# Patient Record
Sex: Female | Born: 1980 | Race: White | Hispanic: No | Marital: Single | Smoking: Current every day smoker
Health system: Southern US, Community
[De-identification: ages and names within clinical notes are randomized; demographics above are authoritative.]

## PROBLEM LIST (undated history)

## (undated) DIAGNOSIS — F419 Anxiety disorder, unspecified: Secondary | ICD-10-CM

## (undated) HISTORY — PX: MANDIBLE RECONSTRUCTION: SHX431

---
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
## 1999-11-27 ENCOUNTER — Encounter: Payer: Self-pay | Admitting: Family Medicine

## 1999-11-27 ENCOUNTER — Encounter: Admission: RE | Admit: 1999-11-27 | Discharge: 1999-11-27 | Payer: Self-pay | Admitting: Family Medicine

## 2000-12-25 ENCOUNTER — Emergency Department (HOSPITAL_COMMUNITY): Admission: EM | Admit: 2000-12-25 | Discharge: 2000-12-25 | Payer: Self-pay | Admitting: Physical Therapy

## 2000-12-26 ENCOUNTER — Emergency Department (HOSPITAL_COMMUNITY): Admission: EM | Admit: 2000-12-26 | Discharge: 2000-12-27 | Payer: Self-pay | Admitting: Emergency Medicine

## 2001-02-21 ENCOUNTER — Inpatient Hospital Stay (HOSPITAL_COMMUNITY): Admission: AD | Admit: 2001-02-21 | Discharge: 2001-02-23 | Payer: Self-pay | Admitting: Neurology

## 2001-02-21 ENCOUNTER — Encounter: Payer: Self-pay | Admitting: Neurology

## 2002-06-01 ENCOUNTER — Ambulatory Visit (HOSPITAL_COMMUNITY): Admission: RE | Admit: 2002-06-01 | Discharge: 2002-06-01 | Payer: Self-pay | Admitting: Neurology

## 2002-06-01 ENCOUNTER — Encounter: Payer: Self-pay | Admitting: Neurology

## 2004-05-01 ENCOUNTER — Ambulatory Visit: Payer: Self-pay | Admitting: Family Medicine

## 2004-06-04 ENCOUNTER — Emergency Department (HOSPITAL_COMMUNITY): Admission: EM | Admit: 2004-06-04 | Discharge: 2004-06-04 | Payer: Self-pay | Admitting: Emergency Medicine

## 2004-06-09 ENCOUNTER — Emergency Department (HOSPITAL_COMMUNITY): Admission: EM | Admit: 2004-06-09 | Discharge: 2004-06-09 | Payer: Self-pay | Admitting: Emergency Medicine

## 2004-08-08 ENCOUNTER — Emergency Department (HOSPITAL_COMMUNITY): Admission: EM | Admit: 2004-08-08 | Discharge: 2004-08-08 | Payer: Self-pay | Admitting: Emergency Medicine

## 2005-01-13 ENCOUNTER — Emergency Department (HOSPITAL_COMMUNITY): Admission: EM | Admit: 2005-01-13 | Discharge: 2005-01-13 | Payer: Self-pay | Admitting: Family Medicine

## 2005-02-04 ENCOUNTER — Emergency Department (HOSPITAL_COMMUNITY): Admission: EM | Admit: 2005-02-04 | Discharge: 2005-02-04 | Payer: Self-pay | Admitting: Family Medicine

## 2005-02-12 ENCOUNTER — Emergency Department (HOSPITAL_COMMUNITY): Admission: EM | Admit: 2005-02-12 | Discharge: 2005-02-12 | Payer: Self-pay | Admitting: Family Medicine

## 2005-03-06 ENCOUNTER — Emergency Department (HOSPITAL_COMMUNITY): Admission: EM | Admit: 2005-03-06 | Discharge: 2005-03-06 | Payer: Self-pay | Admitting: Emergency Medicine

## 2005-09-23 ENCOUNTER — Emergency Department (HOSPITAL_COMMUNITY): Admission: EM | Admit: 2005-09-23 | Discharge: 2005-09-23 | Payer: Self-pay | Admitting: Emergency Medicine

## 2017-09-03 ENCOUNTER — Emergency Department (HOSPITAL_BASED_OUTPATIENT_CLINIC_OR_DEPARTMENT_OTHER): Payer: Medicaid Other

## 2017-09-03 ENCOUNTER — Other Ambulatory Visit: Payer: Self-pay

## 2017-09-03 ENCOUNTER — Encounter (HOSPITAL_BASED_OUTPATIENT_CLINIC_OR_DEPARTMENT_OTHER): Payer: Self-pay | Admitting: Emergency Medicine

## 2017-09-03 ENCOUNTER — Emergency Department (HOSPITAL_BASED_OUTPATIENT_CLINIC_OR_DEPARTMENT_OTHER)
Admission: EM | Admit: 2017-09-03 | Discharge: 2017-09-03 | Disposition: A | Discharge status: Home / Self Care | Payer: Medicaid Other | Attending: Emergency Medicine | Admitting: Emergency Medicine

## 2017-09-03 DIAGNOSIS — W010XXA Fall on same level from slipping, tripping and stumbling without subsequent striking against object, initial encounter: Secondary | ICD-10-CM | POA: Insufficient documentation

## 2017-09-03 DIAGNOSIS — Z79899 Other long term (current) drug therapy: Secondary | ICD-10-CM | POA: Diagnosis not present

## 2017-09-03 DIAGNOSIS — S2231XA Fracture of one rib, right side, initial encounter for closed fracture: Secondary | ICD-10-CM | POA: Diagnosis not present

## 2017-09-03 DIAGNOSIS — Y92195 Garage of other specified residential institution as the place of occurrence of the external cause: Secondary | ICD-10-CM | POA: Diagnosis not present

## 2017-09-03 DIAGNOSIS — S299XXA Unspecified injury of thorax, initial encounter: Secondary | ICD-10-CM | POA: Diagnosis present

## 2017-09-03 DIAGNOSIS — Y998 Other external cause status: Secondary | ICD-10-CM | POA: Insufficient documentation

## 2017-09-03 DIAGNOSIS — Y9389 Activity, other specified: Secondary | ICD-10-CM | POA: Diagnosis not present

## 2017-09-03 DIAGNOSIS — F17200 Nicotine dependence, unspecified, uncomplicated: Secondary | ICD-10-CM | POA: Diagnosis not present

## 2017-09-03 DIAGNOSIS — M542 Cervicalgia: Secondary | ICD-10-CM | POA: Insufficient documentation

## 2017-09-03 DIAGNOSIS — W19XXXA Unspecified fall, initial encounter: Secondary | ICD-10-CM

## 2017-09-03 HISTORY — DX: Anxiety disorder, unspecified: F41.9

## 2017-09-03 LAB — PREGNANCY, URINE: Preg Test, Ur: NEGATIVE

## 2017-09-03 MED ORDER — MELOXICAM 15 MG PO TABS: 15.0000 mg | ORAL_TABLET | Freq: Every day | ORAL | 0 refills | Status: AC

## 2017-09-03 MED ORDER — CYCLOBENZAPRINE HCL 10 MG PO TABS: 5.0000 mg | ORAL_TABLET | Freq: Two times a day (BID) | ORAL | 0 refills | Status: AC | PRN

## 2017-09-03 MED ORDER — ONDANSETRON 4 MG PO TBDP
4.0000 mg | ORAL_TABLET | Freq: Once | ORAL | Status: AC
  Administered 2017-09-03: 4 mg via ORAL
  Filled 2017-09-03: qty 1

## 2017-09-03 MED ORDER — HYDROCODONE-ACETAMINOPHEN 5-325 MG PO TABS
1.0000 | ORAL_TABLET | Freq: Once | ORAL | Status: AC
  Administered 2017-09-03: 1 via ORAL
  Filled 2017-09-03: qty 1

## 2017-09-03 MED ORDER — HYDROCODONE-ACETAMINOPHEN 5-325 MG PO TABS: 1.0000 | ORAL_TABLET | Freq: Four times a day (QID) | ORAL | 0 refills | Status: AC | PRN

## 2017-09-03 NOTE — Discharge Instructions (Addendum)
Get help right away if: You have a fever. You have difficulty breathing or shortness of breath. You develop a continual cough, or you cough up thick or bloody sputum. You feel sick to your stomach (nausea), throw up (vomit), or have abdominal pain. You have worsening pain not controlled with medications.

## 2017-09-03 NOTE — ED Triage Notes (Signed)
Patient states that she fell a few days ago from a standing postion onto a cement driveway - the patient reports that she has soreness from her neck to her "butt"

## 2017-09-03 NOTE — ED Notes (Signed)
Pt states she slipped on some motor oil and landed on her buttocks first, then her back. C/O pain from just below neck down to buttocks. MAE x 4. States she did hit her head, but did not lose consciousness. Neuro WNL.

## 2017-09-03 NOTE — ED Provider Notes (Signed)
[facility] EMERGENCY DEPARTMENT Provider Note   CSN: [id number] Arrival date & time: 09/03/17  1952     History   Chief Complaint Chief Complaint  Patient presents with  . Fall    HPI Sierra Alvarez is a 37 y.o. female who presents for evaluation of a fall.  Patient states that she was walking in her garage when she slipped on [employer] oil, her feet flew up and she landed flat on her back.  She states that she held her head up so she would not hit it but thinks she strained the front of her neck.  Since that time she complains of pain from her neck all the way down to her bottom.  She states her pain is worst in the lower to mid thoracic region.  She is some pain when she takes a deep breaths, twists or moves.  She is also having pain with movement of her neck.  She denies any numbness or tingling.  She took ibuprofen with minimal relief of her symptoms.  She states that her pain has been the same since the fall and has not improved or worsened.  HPI  Past Medical History:  Diagnosis Date  . Anxiety     There are no active problems to display for this patient.   Past Surgical History:  Procedure Laterality Date  . MANDIBLE RECONSTRUCTION       OB History   None      Home Medications    Prior to Admission medications   Medication Sig Start Date End Date Taking? Authorizing Provider  gabapentin (NEURONTIN) 300 MG capsule Take 300 mg by mouth 3 (three) times daily.   Yes [provider]    Family History History reviewed. No pertinent family history.  Social History Social History   Tobacco Use  . Smoking status: Current Every Day Smoker  . Smokeless tobacco: Never Used  Substance Use Topics  . Alcohol use: Not Currently  . Drug use: Not Currently     Allergies   Paxil [paroxetine hcl] and Tramadol   Review of Systems Review of Systems  Ten systems reviewed and are negative for acute change, except as noted in the HPI.   Physical  Exam Updated Vital Signs BP (!) 143/102 (BP Location: Right Arm)   Pulse 78   Temp 98.4 F (36.9 C) (Oral)   Resp 16   Ht 5\' 5"  (1.651 m)   Wt 82.6 kg (182 lb)   LMP 08/07/2017   SpO2 99%   BMI 30.29 kg/m   Physical Exam  Constitutional: She is oriented to person, place, and time. She appears well-developed and well-nourished. No distress.  HENT:  Head: Normocephalic and atraumatic.  Eyes: Pupils are equal, round, and reactive to light. Conjunctivae and EOM are normal. No scleral icterus.  Neck: Normal range of motion.  Patient holding neck in a sniffing position unable to to turn without a lot of pain.  [facility]  Cardiovascular: Normal rate, regular rhythm and normal heart sounds. Exam reveals no gallop and no friction rub.  No murmur heard. Pulmonary/Chest: Effort normal and breath sounds normal. No respiratory distress.  Abdominal: Soft. Bowel sounds are normal. She exhibits no distension and no mass. There is no tenderness. There is no guarding.  Musculoskeletal:  Midline spinal tenderness or bruising.  Pain with any motion in the lower thoracic region.  Neurological: She is alert and oriented to person, place, and time.  Skin: Skin  is warm and dry. She is not diaphoretic.  Psychiatric: Her behavior is normal.  Nursing note and vitals reviewed.    ED Treatments / Results  Labs (all labs ordered are listed, but only abnormal results are displayed) Labs Reviewed  PREGNANCY, URINE    EKG None  Radiology Dg Thoracic Spine W/swimmers  Result Date: 09/03/2017 CLINICAL DATA:  Mid lower back pain after fall 3 days ago. EXAM: THORACIC SPINE - 3 VIEWS COMPARISON:  None. FINDINGS: There is no evidence of thoracic spine fracture. Twelve ribbed thoracic vertebrae. Normal alignment. No significant disc space narrowing. Suspicious osseous lesions. IMPRESSION: No acute osseous abnormality of the thoracic spine. Electronically Signed   By: Tollie Eth M.D.   On:  09/03/2017 22:19   Dg Lumbar Spine Complete  Result Date: 09/03/2017 CLINICAL DATA:  Pain after fall EXAM: LUMBAR SPINE - COMPLETE 4+ VIEW COMPARISON:  None. FINDINGS: There is no evidence of lumbar spine fracture. Alignment is normal. Intervertebral disc spaces are maintained. Lumbar facet arthropathy L3 through S1 with joint space narrowing and sclerosis. Slightly angulated appearance of the medial right twelfth rib suspicious for possible fracture. Correlate for pain on the right at this level. IMPRESSION: Angulated appearance of the medial right twelfth rib suspicious for fracture. Correlate for pain at this level. No acute lumbar spine fracture. Electronically Signed   By: Tollie Eth M.D.   On: 09/03/2017 22:25   Ct Cervical Spine Wo Contrast  Result Date: 09/03/2017 CLINICAL DATA:  Fall onto concrete 3 days ago.  Head and neck pain. EXAM: CT CERVICAL SPINE WITHOUT CONTRAST TECHNIQUE: Multidetector CT imaging of the cervical spine was performed without intravenous contrast. Multiplanar CT image reconstructions were also generated. COMPARISON:  None. FINDINGS: Alignment: No static subluxation. Facets are aligned. Occipital condyles and the lateral masses of C1 and C2 are normally approximated. Skull base and vertebrae: No acute fracture. Soft tissues and spinal canal: No prevertebral fluid or swelling. No visible canal hematoma. Disc levels: No advanced spinal canal or neural foraminal stenosis. Upper chest: No pneumothorax, pulmonary nodule or pleural effusion. Other: Normal visualized paraspinal cervical soft tissues. IMPRESSION: No acute fracture or static subluxation of the cervical spine. Electronically Signed   By: Deatra Robinson M.D.   On: 09/03/2017 22:33    Procedures Procedures (including critical care time)  Medications Ordered in ED Medications  HYDROcodone-acetaminophen (NORCO/VICODIN) 5-325 MG per tablet 1 tablet (1 tablet Oral Given 09/03/17 2243)  ondansetron (ZOFRAN-ODT)  disintegrating tablet 4 mg (4 mg Oral Given 09/03/17 2238)     Initial Impression / Assessment and Plan / ED Course  I have reviewed the triage vital signs and the nursing notes.  Pertinent labs & imaging results that were available during my care of the patient were reviewed by me and considered in my medical decision making (see chart for details).     Patient with fall, 12th rib fracture on x-ray.  Ridging is otherwise negative.  Patient given definitive Fracture care with incentive spirometer.  Discharged with pain medications, muscle relaxer and anti-inflammatory.  Patient appears appropriate for discharge at this time.  I reviewed the patient on the [place] drug database. Final Clinical Impressions(s) / ED Diagnoses   Final diagnoses:  Fall    ED Discharge Orders    None       Arthor Captain, PA-C 09/04/17 0019    Little, Ambrose Finland, MD 09/04/17 [phone]

## 2017-09-03 NOTE — ED Notes (Signed)
Pt given d/c instructions as per chart. Rx x 3 with precautions. Verbalizes understanding. No questions. 

## 2020-02-18 IMAGING — DX DG THORACIC SPINE 3V
3 series · 3 of 3 positions shown · non-contrast
Comparison: None.

CLINICAL DATA: Mid lower back pain after fall 3 days ago.

EXAM:
THORACIC SPINE - 3 VIEWS

[t-spine ap]
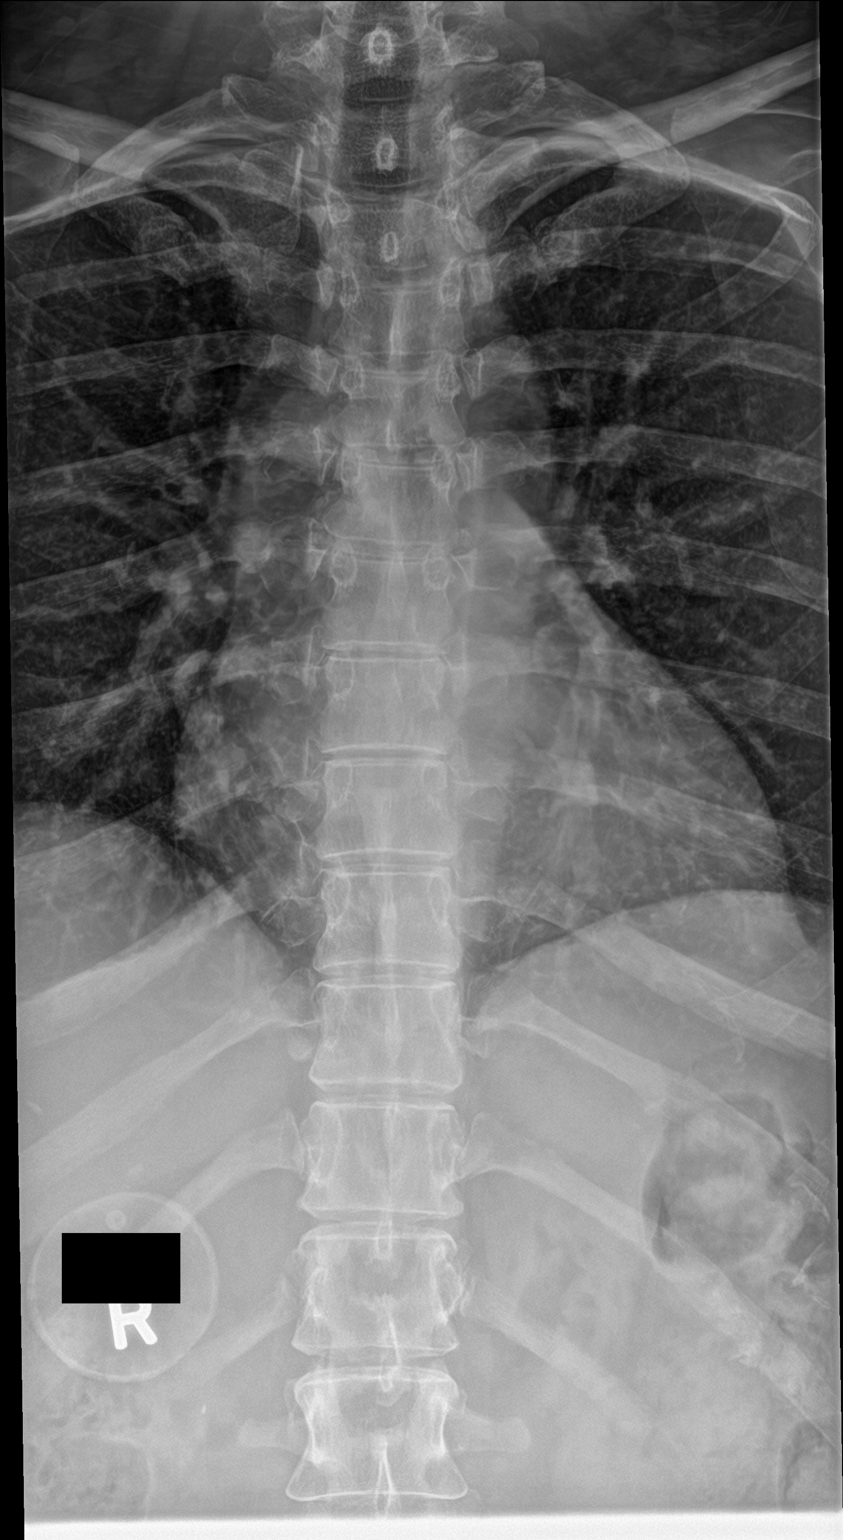

[t-spine lat]
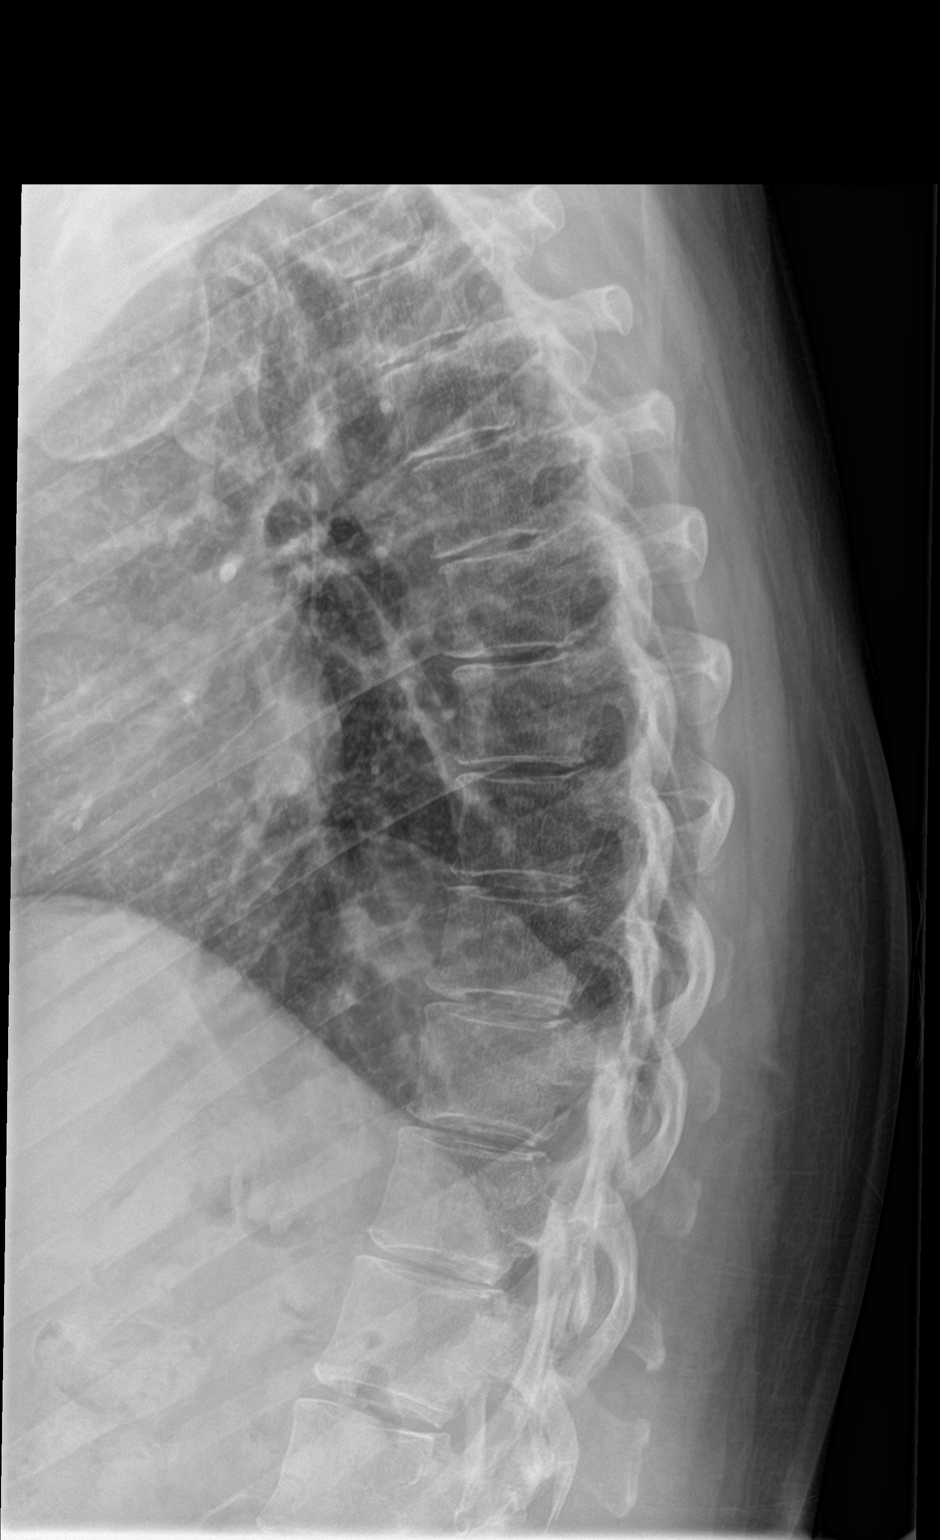

[t-spine swimmers]
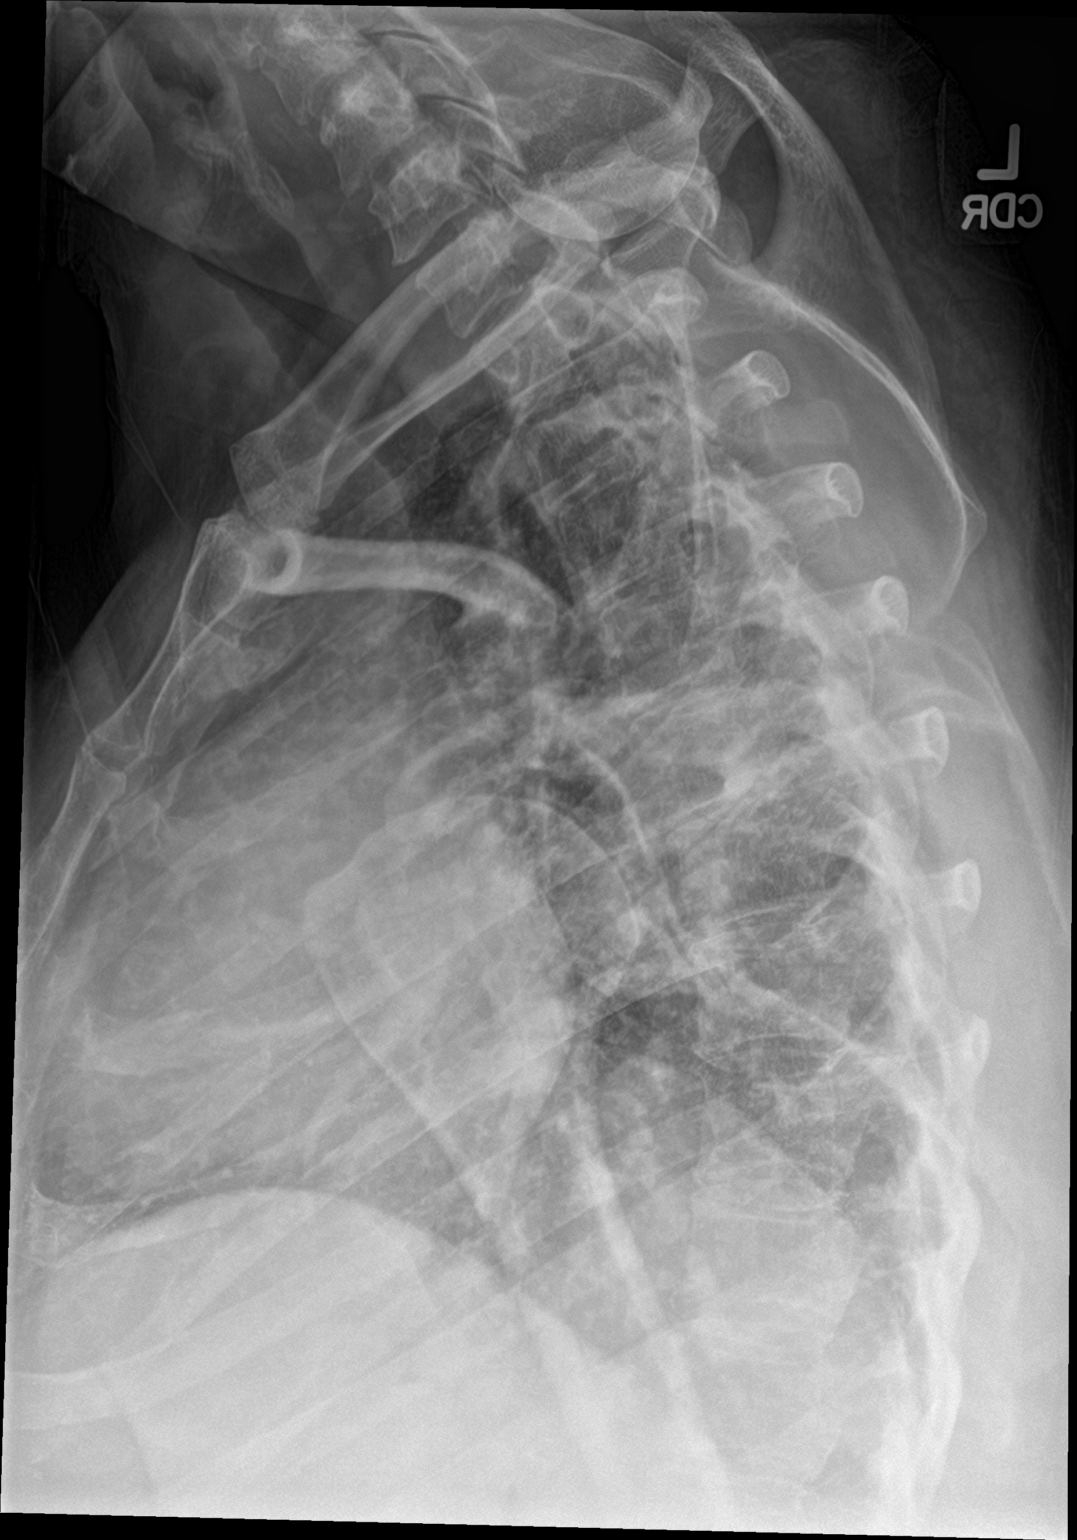

[3 of 3 positions shown; findings below may reference images not displayed]

FINDINGS: There is no evidence of thoracic spine fracture. Twelve ribbed
thoracic vertebrae. Normal alignment. No significant disc space
narrowing. Suspicious osseous lesions.
IMPRESSION: No acute osseous abnormality of the thoracic spine.
